# Patient Record
Sex: Female | Born: 1985 | Race: White | Hispanic: No | Marital: Single | Smoking: Current every day smoker
Health system: Southern US, Community
[De-identification: ages and names within clinical notes are randomized; demographics above are authoritative.]

---

## 2014-03-05 ENCOUNTER — Emergency Department (HOSPITAL_COMMUNITY): Payer: Self-pay

## 2014-03-05 ENCOUNTER — Encounter (HOSPITAL_COMMUNITY): Payer: Self-pay | Admitting: Emergency Medicine

## 2014-03-05 ENCOUNTER — Emergency Department (HOSPITAL_COMMUNITY)
Admission: EM | Admit: 2014-03-05 | Discharge: 2014-03-05 | Disposition: A | Discharge status: Home / Self Care | Payer: Self-pay | Attending: Emergency Medicine | Admitting: Emergency Medicine

## 2014-03-05 DIAGNOSIS — S134XXA Sprain of ligaments of cervical spine, initial encounter: Secondary | ICD-10-CM

## 2014-03-05 DIAGNOSIS — Z720 Tobacco use: Secondary | ICD-10-CM | POA: Insufficient documentation

## 2014-03-05 DIAGNOSIS — S0990XA Unspecified injury of head, initial encounter: Secondary | ICD-10-CM | POA: Insufficient documentation

## 2014-03-05 DIAGNOSIS — Y9389 Activity, other specified: Secondary | ICD-10-CM | POA: Insufficient documentation

## 2014-03-05 DIAGNOSIS — S1980XA Other specified injuries of unspecified part of neck, initial encounter: Secondary | ICD-10-CM | POA: Insufficient documentation

## 2014-03-05 DIAGNOSIS — Y92410 Unspecified street and highway as the place of occurrence of the external cause: Secondary | ICD-10-CM | POA: Insufficient documentation

## 2014-03-05 MED ORDER — IBUPROFEN 800 MG PO TABS
800.0000 mg | ORAL_TABLET | Freq: Once | ORAL | Status: AC
  Administered 2014-03-05: 800 mg via ORAL

## 2014-03-05 MED ORDER — IBUPROFEN 800 MG PO TABS: 800.0000 mg | ORAL_TABLET | Freq: Three times a day (TID) | ORAL | Status: AC

## 2014-03-05 MED ORDER — ORPHENADRINE CITRATE ER 100 MG PO TB12: 100.0000 mg | ORAL_TABLET | Freq: Two times a day (BID) | ORAL | Status: AC

## 2014-03-05 NOTE — ED Notes (Signed)
Removed C-Collar with MD approval.
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
## 2014-03-05 NOTE — ED Notes (Signed)
Family at bedside. 

## 2014-03-05 NOTE — ED Notes (Signed)
Dr. Pfeiffer at the bedside.  
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
